# Patient Record
Sex: Female | Born: 1960 | Race: White | Hispanic: No
Health system: Southern US, Community
[De-identification: ages and names within clinical notes are randomized; demographics above are authoritative.]

---

## 2014-06-30 HISTORY — PX: BREAST EXCISIONAL BIOPSY: SUR124

## 2015-06-08 ENCOUNTER — Other Ambulatory Visit: Payer: Self-pay | Admitting: *Deleted

## 2015-06-08 ENCOUNTER — Inpatient Hospital Stay
Admission: RE | Admit: 2015-06-08 | Discharge: 2015-06-08 | Disposition: A | Discharge status: Home / Self Care | Payer: Self-pay | Source: Ambulatory Visit | Attending: *Deleted | Admitting: *Deleted

## 2015-06-08 DIAGNOSIS — Z9289 Personal history of other medical treatment: Secondary | ICD-10-CM

## 2016-03-05 ENCOUNTER — Encounter (INDEPENDENT_AMBULATORY_CARE_PROVIDER_SITE_OTHER): Payer: Self-pay

## 2016-03-05 ENCOUNTER — Ambulatory Visit: Payer: Medicaid Other

## 2016-03-26 ENCOUNTER — Ambulatory Visit
Admission: RE | Admit: 2016-03-26 | Discharge: 2016-03-26 | Disposition: A | Discharge status: Home / Self Care | Payer: Self-pay | Source: Ambulatory Visit | Attending: Oncology | Admitting: Oncology

## 2016-03-26 ENCOUNTER — Ambulatory Visit: Payer: Medicaid Other | Attending: Oncology | Admitting: *Deleted

## 2016-03-26 VITALS — BP 138/97 | HR 62 | Temp 98.9°F | Ht 66.54 in | Wt 177.7 lb

## 2016-03-26 DIAGNOSIS — N644 Mastodynia: Secondary | ICD-10-CM

## 2016-03-26 NOTE — Progress Notes (Signed)
Subjective:     Patient ID: Kelsey Singh, female   DOB: 1961/04/01, 55 y.o.   MRN: [id number]  HPI   Review of Systems     Objective:   Physical Exam  Pulmonary/Chest: Right breast exhibits tenderness. Right breast exhibits no inverted nipple, no mass, no nipple discharge and no skin change. Left breast exhibits tenderness. Left breast exhibits no inverted nipple, no mass, no nipple discharge and no skin change. Breasts are asymmetrical.         Assessment:     55 year old female [facility], and mammogram.  Patient states she has been experiencing an intermittent "pin sticking" like pain at 9:00 right breast, at the site of her scar.  States it has been happening for about 2 weeks.  Aggravating factors includes increased work with her arms.  No alleviating factors.  On clinical breast exam there is palpable tenderness at her scar site and the entire lateral right breast.  She states there is some tenderness in the left breast, but not as much as the right breast.  Taught self breast awareness.  Patient has been screened for eligibility.  She does not have any insurance, Medicare or Medicaid.  She also meets financial eligibility.  Hand-out given on the Affordable Care Act.     Plan:    Will get bilateral diagnostic mammogram with ultrasound.  If no findings on imaging, discussed with patient that I will have her return in about 2-3 months for a repeat breast exam.  Will follow-up per [facility] protocol.

## 2016-03-27 ENCOUNTER — Encounter: Payer: Self-pay | Admitting: *Deleted

## 2016-03-27 NOTE — Patient Instructions (Signed)
Gave patient hand-out, Women Staying Healthy, Active and Well from [facility], with education on breast health, pap smears, heart and colon health. 

## 2016-04-04 ENCOUNTER — Other Ambulatory Visit: Payer: Self-pay | Admitting: *Deleted

## 2016-04-04 ENCOUNTER — Telehealth: Payer: Self-pay | Admitting: *Deleted

## 2016-04-04 ENCOUNTER — Encounter: Payer: Self-pay | Admitting: *Deleted

## 2016-04-04 DIAGNOSIS — N63 Unspecified lump in unspecified breast: Secondary | ICD-10-CM

## 2016-04-04 NOTE — Telephone Encounter (Signed)
Called patient to discuss her mammogram results.  I had discussed with her prior to her mammogram, returning for a 3 month follow up to re-assess her pain.  I have scheduled her an appointment to return on 07/02/16 @ 11:00 for repeat clinical breast exam, and a 6 month follow mammogram on 09/24/16 @ 9:20.  Left patient a message to return my call.  Also mailed patient a letter with her appointments.  HSIS to [place].

## 2016-07-02 ENCOUNTER — Ambulatory Visit: Payer: Self-pay

## 2016-07-16 ENCOUNTER — Ambulatory Visit: Payer: Self-pay

## 2016-08-06 ENCOUNTER — Ambulatory Visit: Payer: Self-pay

## 2016-09-24 ENCOUNTER — Ambulatory Visit: Payer: Self-pay

## 2016-09-24 ENCOUNTER — Ambulatory Visit: Payer: Self-pay | Attending: Oncology

## 2016-09-24 ENCOUNTER — Ambulatory Visit: Payer: Medicaid Other

## 2016-10-15 ENCOUNTER — Ambulatory Visit: Payer: Self-pay | Attending: Oncology | Admitting: *Deleted

## 2016-10-15 ENCOUNTER — Encounter (INDEPENDENT_AMBULATORY_CARE_PROVIDER_SITE_OTHER): Payer: Self-pay

## 2016-10-15 ENCOUNTER — Ambulatory Visit
Admission: RE | Admit: 2016-10-15 | Discharge: 2016-10-15 | Disposition: A | Discharge status: Home / Self Care | Payer: Self-pay | Source: Ambulatory Visit | Attending: Oncology | Admitting: Oncology

## 2016-10-15 VITALS — BP 123/85 | HR 64 | Temp 96.8°F | Resp 18 | Ht 66.0 in | Wt 178.0 lb

## 2016-10-15 DIAGNOSIS — N63 Unspecified lump in unspecified breast: Secondary | ICD-10-CM

## 2016-10-15 NOTE — Progress Notes (Signed)
Subjective:     Patient ID: Kelsey Singh, female   DOB: [birth year], 56 y.o.   MRN: [id number]  HPI   Review of Systems     Objective:   Physical Exam  Pulmonary/Chest: Right breast exhibits no inverted nipple, no mass, no nipple discharge, no skin change and no tenderness. Left breast exhibits no inverted nipple, no mass, no nipple discharge, no skin change and no tenderness. Breasts are symmetrical.         Assessment:     Patient returns to [facility] for six month follow-up uni right mammogram and clinical breast exam to reassess previous pain.  Patient states she has an occasional pain in bilateral breast.  States its usually prior to her menstrual cycle.  Her cycles are now becoming more irregular.  No targeted pain noted.  Clinical breast exam unremarkable.  Taught self breast awareness.  Patient has been screened for eligibility.  She does not have any insurance, Medicare or Medicaid.  She also meets financial eligibility.  Hand-out given on the Affordable Care Act.    Plan:     Uni right mammogram and ultrasound ordered.   Will follow-up per [facility] protocol.  Informed patient she will be due for her annual bilateral exam in 6 months.  Informed we will schedule her to return when we have her final results.  She is agreeable.

## 2016-10-15 NOTE — Patient Instructions (Signed)
Gave patient hand-out, Women Staying Healthy, Active and Well from [facility], with education on breast health, pap smears, heart and colon health. 

## 2016-10-17 ENCOUNTER — Encounter: Payer: Self-pay | Admitting: *Deleted

## 2016-10-17 NOTE — Progress Notes (Signed)
Patient with birads 2 six month follow-up mammogram.  She will be due for annual bilateral screening the end of September.  Letter mailed with her next appointment for 04/01/17 @ 9:00.  HSIS to [place].

## 2016-10-21 ENCOUNTER — Other Ambulatory Visit: Payer: Self-pay | Admitting: Internal Medicine

## 2016-10-21 DIAGNOSIS — Z1239 Encounter for other screening for malignant neoplasm of breast: Secondary | ICD-10-CM

## 2017-03-27 ENCOUNTER — Ambulatory Visit: Payer: Self-pay

## 2017-04-01 ENCOUNTER — Ambulatory Visit
Admission: RE | Admit: 2017-04-01 | Discharge: 2017-04-01 | Disposition: A | Discharge status: Home / Self Care | Payer: Self-pay | Source: Ambulatory Visit | Attending: Oncology | Admitting: Oncology

## 2017-04-01 ENCOUNTER — Ambulatory Visit: Payer: Self-pay | Attending: Oncology | Admitting: *Deleted

## 2017-04-01 ENCOUNTER — Encounter: Payer: Self-pay | Admitting: *Deleted

## 2017-04-01 VITALS — BP 100/66 | HR 62 | Temp 98.3°F

## 2017-04-01 DIAGNOSIS — Z0000 Encounter for general adult medical examination without abnormal findings: Secondary | ICD-10-CM

## 2017-04-01 NOTE — Patient Instructions (Signed)
Gave patient hand-out, Women Staying Healthy, Active and Well from [facility], with education on breast health, pap smears, heart and colon health. 

## 2017-04-01 NOTE — Progress Notes (Signed)
Subjective:     Patient ID: Kelsey Singh, female   DOB: 11/10/1960, 56 y.o.   MRN: [id number]  HPI   Review of Systems     Objective:   Physical Exam  Pulmonary/Chest: Right breast exhibits tenderness. Right breast exhibits no inverted nipple, no mass, no nipple discharge and no skin change. Left breast exhibits tenderness. Left breast exhibits no inverted nipple, no mass, no nipple discharge and no skin change. Breasts are asymmetrical.    Left breast larger than the right.  Complains of bilateral breast tenderness       Assessment:     56 year old Black female returns to [facility] for annual exam.  Patient still complains of bilateral all over breast pain.  States she has pain before her periods that still lingers after her periods.  She is still having regular menstrual cycles.  States she is starting to have some hot flashes.  States she only drinks caffeine products occasionally, drinking mostly Koolaid and water.  She does have notable right breast cysts on her last mammogram.  On clinical breast exam there is no dominant mass, skin changes, nipple discharge or lymphadenopathy.  There is tenderness on palpation with scattered fibroglandular like tissue bilateral.  Taught self breast awareness.  Explained some of her tenderness could possible come from the cysts she has and possible perimenopausal changes.  She is to wear a good support bra, decrease caffeine, and discuss using vitamin E with her pharmacist.  She also complains of pain in her hands.  States she has arthritis and is having hand pain.  States she cannot afford her arthritis meds.  I have referred [facility] to [facility] for assistance.  Patient states she had a pap in August at [facility] and has to return for another pap in February.  Patient has been screened for eligibility.  She does not have any insurance, Medicare or Medicaid.  She also meets financial eligibility.  Hand-out given on the Affordable  Care Act.    Plan:     Screening mammogram ordered.  Will follow-up per [facility] protocol.

## 2017-04-08 ENCOUNTER — Encounter: Payer: Self-pay | Admitting: *Deleted

## 2017-04-08 NOTE — Progress Notes (Signed)
Letter mailed from the [facility] to inform patient of her normal mammogram results.  Patient is to follow-up with annual screening in one year.  HSIS to [place]. 

## 2018-04-21 ENCOUNTER — Ambulatory Visit: Payer: Self-pay

## 2018-06-02 ENCOUNTER — Ambulatory Visit: Payer: Self-pay | Attending: Oncology | Admitting: *Deleted

## 2018-06-02 ENCOUNTER — Encounter (INDEPENDENT_AMBULATORY_CARE_PROVIDER_SITE_OTHER): Payer: Self-pay

## 2018-06-02 ENCOUNTER — Encounter: Payer: Self-pay | Admitting: *Deleted

## 2018-06-02 ENCOUNTER — Ambulatory Visit
Admission: RE | Admit: 2018-06-02 | Discharge: 2018-06-02 | Disposition: A | Discharge status: Home / Self Care | Payer: Self-pay | Source: Ambulatory Visit | Attending: Oncology | Admitting: Oncology

## 2018-06-02 VITALS — BP 141/93 | HR 73 | Temp 98.4°F | Ht 66.0 in | Wt 174.3 lb

## 2018-06-02 DIAGNOSIS — Z0000 Encounter for general adult medical examination without abnormal findings: Secondary | ICD-10-CM | POA: Insufficient documentation

## 2018-06-02 NOTE — Patient Instructions (Signed)
Gave patient hand-out, Women Staying Healthy, Active and Well from [facility], with education on breast health, pap smears, heart and colon health. 

## 2018-06-02 NOTE — Progress Notes (Signed)
  Subjective:     Patient ID: Kelsey Singh, female   DOB: 1961-01-25, 57 y.o.   MRN: [id number]  HPI   Review of Systems     Objective:   Physical Exam  Pulmonary/Chest: Right breast exhibits tenderness. Right breast exhibits no inverted nipple, no mass, no nipple discharge and no skin change. Left breast exhibits tenderness. Left breast exhibits no inverted nipple, no mass, no nipple discharge and no skin change.    Abdominal: There is no splenomegaly or hepatomegaly.  Genitourinary: No labial fusion. There is no rash, tenderness, lesion or injury on the right labia. There is no rash, tenderness, lesion or injury on the left labia.         Assessment:     57 [facility] old Black [facility]  Complains of occasional bilateral breast pain.  States she still has irregular periods.  Last period was in October 2019.  Reviewed hormonal changes in the breast could contribute to her breast tenderness.  There is no dominant mass, skin changes, nipple discharge or lymphadenopathy.  Taught self breast awareness. States she had an abnormal pap about a year ago and was supposed to return in 6 months, but she did not go to her appointment.  [facility]  Last pap on 09/05/13 was HPV negative ASCUS.  Specimen collected for pap smear without difficulty. Patient has been screened for eligibility.  She does not have any insurance, Medicare or Medicaid.  She also meets financial eligibility.  Hand-out given on the Affordable Care Act.  Risk Assessment    Risk Scores      06/02/2018   Last edited by: Alta Corningover, Melissa G, CMA   5-year risk: 1.1 %   Lifetime risk: 6.5 %            Plan:     Screening mammogram ordered.  Specimen for pap sent to the lab.  Will follow-up per [facility] protocol.

## 2018-06-05 LAB — PAP LB AND HPV HIGH-RISK: HPV, high-risk: NEGATIVE

## 2018-06-07 ENCOUNTER — Encounter: Payer: Self-pay | Admitting: *Deleted

## 2018-06-07 NOTE — Progress Notes (Signed)
Letter mailed to inform patient of her normal mammogram and pap smear.  Next mammogram due in one year and pap smear due in 5 years.  HSIS to [place].

## 2019-04-29 IMAGING — MG DIGITAL SCREENING BILATERAL MAMMOGRAM WITH TOMO AND CAD
8 series · 8 of 24 positions shown · non-contrast
Comparison: Previous exam(s).

CLINICAL DATA: Screening.

EXAM:
DIGITAL SCREENING BILATERAL MAMMOGRAM WITH TOMO AND CAD

[R MLO synth-2D]
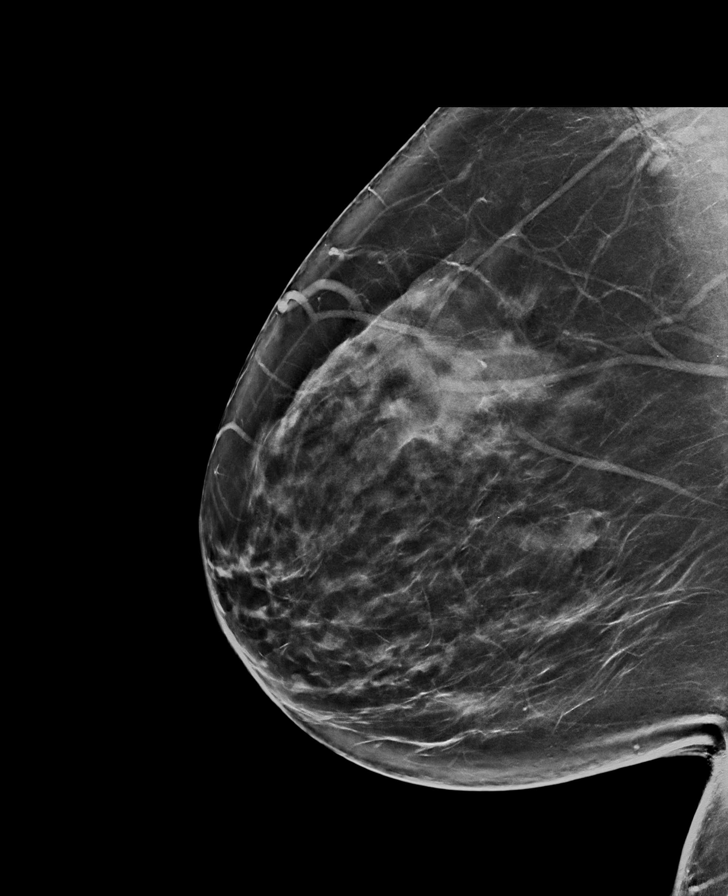

[L CC synth-2D]
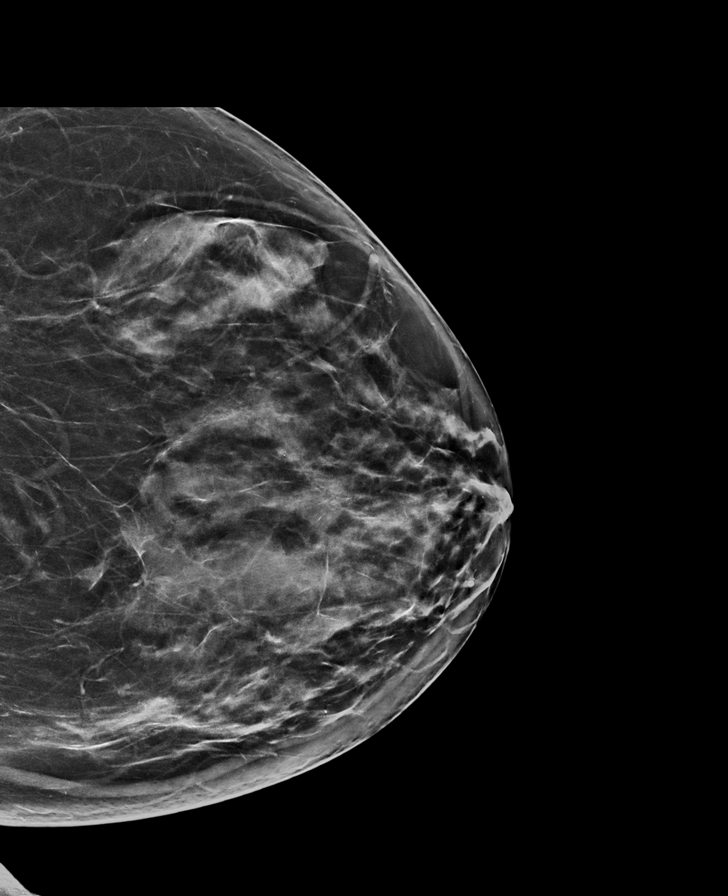

[L MLO synth-2D]
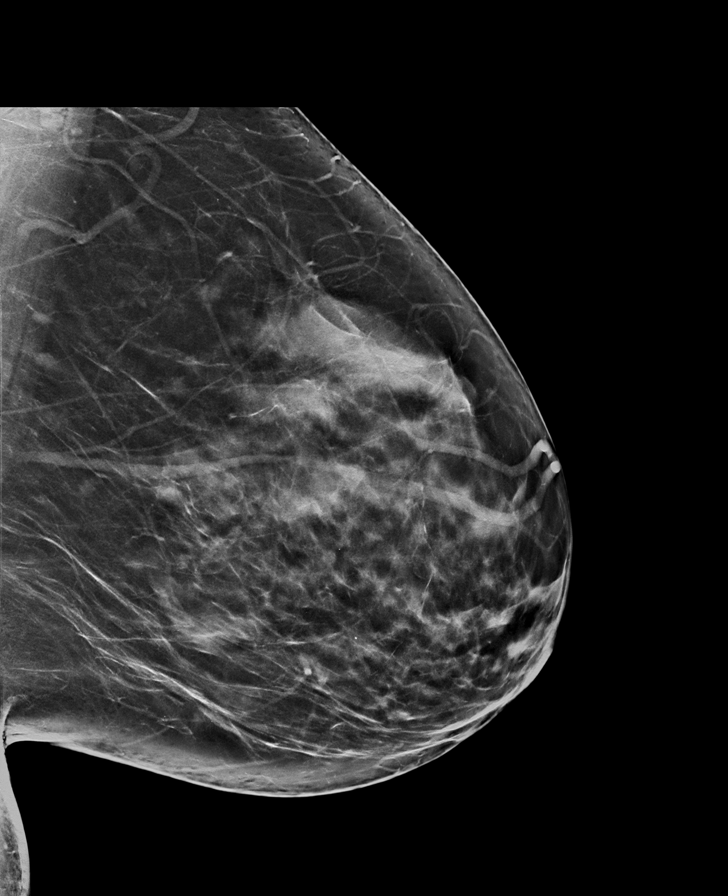

[R CC synth-2D]
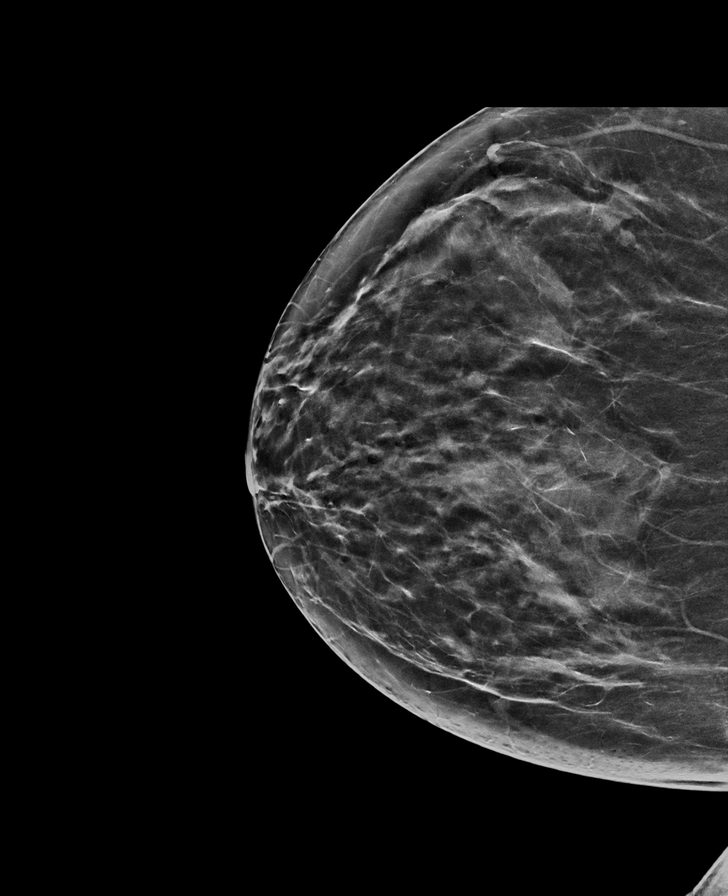

[R CC tomo · tomo slice 36/71.0]
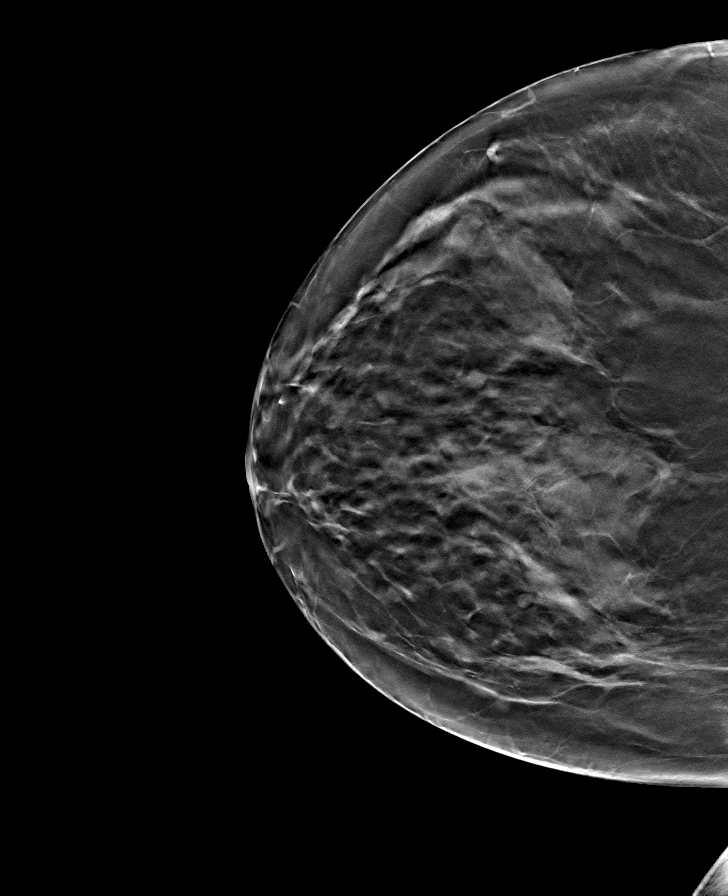

[L CC tomo · tomo slice 39/76.0]
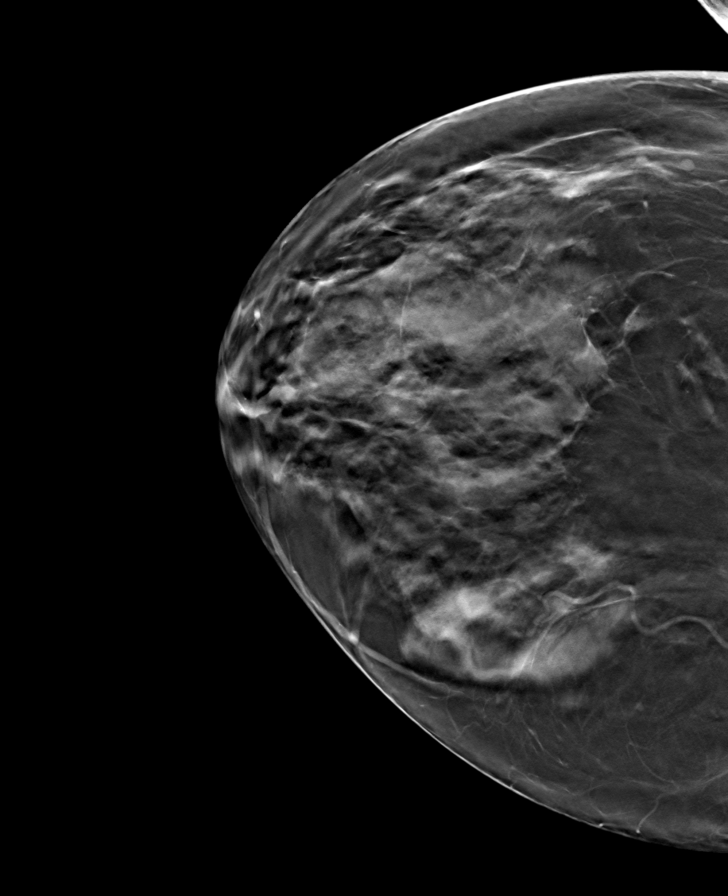

[R MLO tomo · tomo slice 39/78.0]
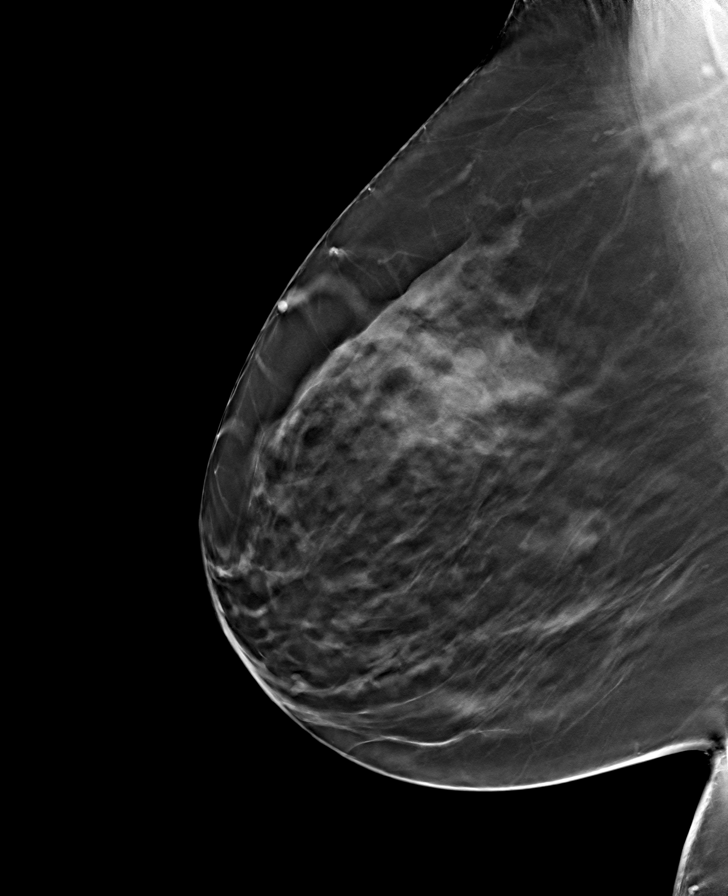

[L MLO tomo · tomo slice 41/82.0]
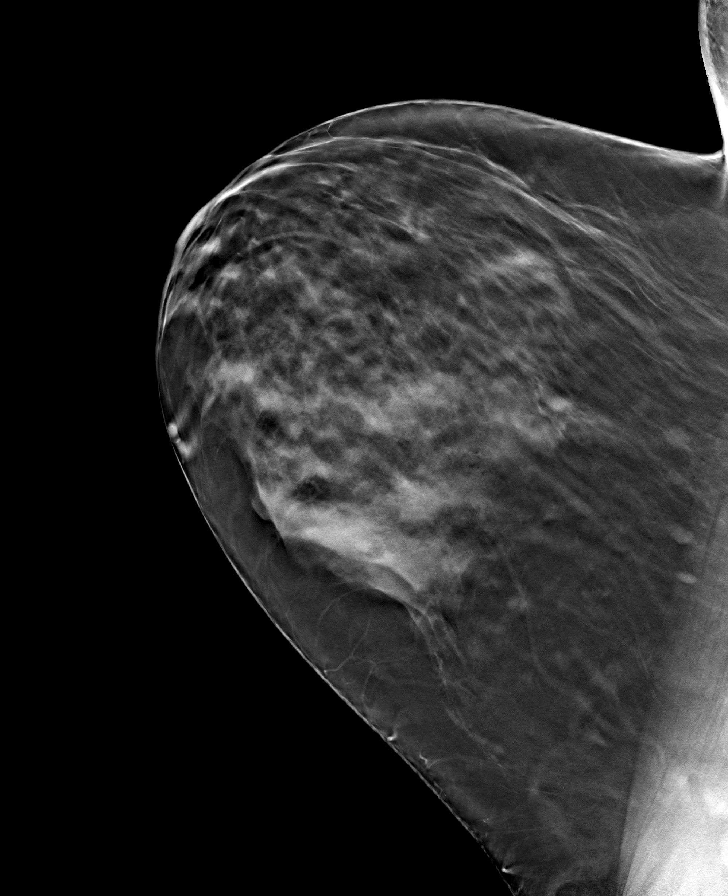

[8 of 24 positions shown; findings below may reference images not displayed]

ACR Breast Density Category c: The breast tissue is heterogeneously
dense, which may obscure small masses.
FINDINGS: There are no findings suspicious for malignancy. Images were
processed with CAD.
IMPRESSION: No mammographic evidence of malignancy. A result letter of this
screening mammogram will be mailed directly to the patient.

RECOMMENDATION:
Screening mammogram in one year. (Code:FT-U-LHB)

BI-RADS CATEGORY  1: Negative.

## 2019-08-24 ENCOUNTER — Other Ambulatory Visit: Payer: Self-pay | Admitting: Internal Medicine

## 2019-08-24 DIAGNOSIS — Z1231 Encounter for screening mammogram for malignant neoplasm of breast: Secondary | ICD-10-CM

## 2021-07-17 ENCOUNTER — Other Ambulatory Visit: Payer: Self-pay

## 2021-07-17 DIAGNOSIS — Z1231 Encounter for screening mammogram for malignant neoplasm of breast: Secondary | ICD-10-CM

## 2021-08-21 ENCOUNTER — Other Ambulatory Visit: Payer: Self-pay

## 2021-08-21 DIAGNOSIS — Z1211 Encounter for screening for malignant neoplasm of colon: Secondary | ICD-10-CM

## 2021-08-27 ENCOUNTER — Ambulatory Visit: Payer: Medicaid Other | Attending: Oncology

## 2021-09-04 ENCOUNTER — Other Ambulatory Visit: Payer: Self-pay

## 2021-09-04 DIAGNOSIS — Z1231 Encounter for screening mammogram for malignant neoplasm of breast: Secondary | ICD-10-CM

## 2021-09-12 ENCOUNTER — Other Ambulatory Visit: Payer: Self-pay

## 2021-09-12 ENCOUNTER — Telehealth: Payer: Self-pay

## 2021-09-12 NOTE — Telephone Encounter (Signed)
Called to schedule colonoscopy np no answer left a voicemail for a call back ?

## 2021-09-13 ENCOUNTER — Other Ambulatory Visit: Payer: Self-pay

## 2021-09-13 DIAGNOSIS — Z1211 Encounter for screening for malignant neoplasm of colon: Secondary | ICD-10-CM

## 2021-09-13 MED ORDER — NA SULFATE-K SULFATE-MG SULF 17.5-3.13-1.6 GM/177ML PO SOLN: 1.0000 | Freq: Once | ORAL | 0 refills | Status: AC

## 2021-09-13 NOTE — Progress Notes (Signed)
Gastroenterology Pre-Procedure Review ? ?Request Date: 10/16/2021 ?Requesting Physician: Dr. Tobi Bastos ? ?PATIENT REVIEW QUESTIONS: The patient responded to the following health history questions as indicated:   ? ?1. Are you having any GI issues? no ?2. Do you have a personal history of Polyps? no ?3. Do you have a family history of Colon Cancer or Polyps? no ?4. Diabetes Mellitus? no ?5. Joint replacements in the past 12 months?no ?6. Major health problems in the past 3 months?no ?7. Any artificial heart valves, MVP, or defibrillator?no ?   ?MEDICATIONS & ALLERGIES:    ?Patient reports the following regarding taking any anticoagulation/antiplatelet therapy:   ?Plavix, Coumadin, Eliquis, Xarelto, Lovenox, Pradaxa, Brilinta, or Effient? no ?Aspirin? no ? ?Patient confirms/reports the following medications:  ?No current outpatient medications on file.  ? ?No current facility-administered medications for this visit.  ? ? ?Patient confirms/reports the following allergies:  ?No Known Allergies ? ?No orders of the defined types were placed in this encounter. ? ? ?AUTHORIZATION INFORMATION ?Primary Insurance: ?1D#: ?Group #: ? ?Secondary Insurance: ?1D#: ?Group #: ? ?SCHEDULE INFORMATION: ?Date: 10/16/2021 ?Time: ?Location:[facility] ? ?

## 2021-09-16 ENCOUNTER — Other Ambulatory Visit: Payer: Self-pay

## 2021-09-16 MED ORDER — PEG 3350-KCL-NA BICARB-NACL 420 G PO SOLR: ORAL | 0 refills | Status: AC

## 2021-09-24 ENCOUNTER — Ambulatory Visit: Payer: 59

## 2021-10-16 ENCOUNTER — Ambulatory Visit: Admission: RE | Admit: 2021-10-16 | Payer: 59 | Source: Ambulatory Visit | Admitting: Gastroenterology

## 2021-10-16 ENCOUNTER — Encounter: Admission: RE | Payer: Self-pay | Source: Ambulatory Visit

## 2021-10-16 SURGERY — COLONOSCOPY WITH PROPOFOL
Anesthesia: General

## 2021-10-21 ENCOUNTER — Telehealth: Payer: Self-pay | Admitting: Gastroenterology

## 2021-10-21 NOTE — Telephone Encounter (Signed)
[facility] are requesting results from pt colonoscopy the procedure was cancelled. ?

## 2021-12-25 ENCOUNTER — Telehealth: Payer: Self-pay

## 2021-12-25 NOTE — Telephone Encounter (Signed)
CALLED PATIENT NO ANSWER LEFT VOICEMAIL FOR A CALL BACK ? ?

## 2022-01-08 ENCOUNTER — Telehealth: Payer: Self-pay

## 2022-01-08 ENCOUNTER — Ambulatory Visit: Payer: 59

## 2022-01-08 NOTE — Telephone Encounter (Signed)
Returned patients phone call.  Left message with family member to have her to call me back.  Thanks, [place], [place]

## 2022-01-08 NOTE — Telephone Encounter (Signed)
Patient is calling to reschedule her colonoscopy. Please call patient call on primary number or [phone]

## 2022-02-05 DIAGNOSIS — Z124 Encounter for screening for malignant neoplasm of cervix: Secondary | ICD-10-CM | POA: Diagnosis not present

## 2022-03-24 DIAGNOSIS — R2231 Localized swelling, mass and lump, right upper limb: Secondary | ICD-10-CM | POA: Diagnosis not present

## 2022-04-02 DIAGNOSIS — E785 Hyperlipidemia, unspecified: Secondary | ICD-10-CM | POA: Diagnosis not present

## 2022-04-02 DIAGNOSIS — I10 Essential (primary) hypertension: Secondary | ICD-10-CM | POA: Diagnosis not present

## 2022-04-02 DIAGNOSIS — R69 Illness, unspecified: Secondary | ICD-10-CM | POA: Diagnosis not present

## 2022-04-02 DIAGNOSIS — M1990 Unspecified osteoarthritis, unspecified site: Secondary | ICD-10-CM | POA: Diagnosis not present

## 2022-04-02 DIAGNOSIS — Z791 Long term (current) use of non-steroidal anti-inflammatories (NSAID): Secondary | ICD-10-CM | POA: Diagnosis not present

## 2022-04-02 DIAGNOSIS — Z8249 Family history of ischemic heart disease and other diseases of the circulatory system: Secondary | ICD-10-CM | POA: Diagnosis not present

## 2022-04-17 DIAGNOSIS — N6310 Unspecified lump in the right breast, unspecified quadrant: Secondary | ICD-10-CM | POA: Diagnosis not present

## 2022-04-17 DIAGNOSIS — R92333 Mammographic heterogeneous density, bilateral breasts: Secondary | ICD-10-CM | POA: Diagnosis not present

## 2022-04-17 DIAGNOSIS — N6489 Other specified disorders of breast: Secondary | ICD-10-CM | POA: Diagnosis not present

## 2022-04-17 DIAGNOSIS — N644 Mastodynia: Secondary | ICD-10-CM | POA: Diagnosis not present

## 2022-04-18 DIAGNOSIS — M25512 Pain in left shoulder: Secondary | ICD-10-CM | POA: Diagnosis not present

## 2022-04-18 DIAGNOSIS — M25511 Pain in right shoulder: Secondary | ICD-10-CM | POA: Diagnosis not present

## 2022-05-09 DIAGNOSIS — M25511 Pain in right shoulder: Secondary | ICD-10-CM | POA: Diagnosis not present

## 2022-05-09 DIAGNOSIS — M25512 Pain in left shoulder: Secondary | ICD-10-CM | POA: Diagnosis not present
